# Patient Record
Sex: Female | Born: 1960 | Race: White | Hispanic: No | Marital: Married
Health system: Southern US, Community
[De-identification: ages and names within clinical notes are randomized; demographics above are authoritative.]

---
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
## 1999-06-03 ENCOUNTER — Other Ambulatory Visit: Admission: RE | Admit: 1999-06-03 | Discharge: 1999-06-03 | Payer: Self-pay | Admitting: Obstetrics and Gynecology

## 2000-09-16 ENCOUNTER — Other Ambulatory Visit: Admission: RE | Admit: 2000-09-16 | Discharge: 2000-09-16 | Payer: Self-pay | Admitting: Obstetrics and Gynecology

## 2001-09-20 ENCOUNTER — Other Ambulatory Visit: Admission: RE | Admit: 2001-09-20 | Discharge: 2001-09-20 | Payer: Self-pay | Admitting: Obstetrics and Gynecology

## 2002-09-20 ENCOUNTER — Other Ambulatory Visit: Admission: RE | Admit: 2002-09-20 | Discharge: 2002-09-20 | Payer: Self-pay | Admitting: Obstetrics and Gynecology

## 2003-10-01 ENCOUNTER — Other Ambulatory Visit: Admission: RE | Admit: 2003-10-01 | Discharge: 2003-10-01 | Payer: Self-pay | Admitting: Obstetrics and Gynecology

## 2004-12-27 HISTORY — PX: BREAST BIOPSY: SHX20

## 2005-02-11 ENCOUNTER — Other Ambulatory Visit: Admission: RE | Admit: 2005-02-11 | Discharge: 2005-02-11 | Payer: Self-pay | Admitting: Obstetrics and Gynecology

## 2005-03-08 ENCOUNTER — Encounter: Admission: RE | Admit: 2005-03-08 | Discharge: 2005-03-08 | Payer: Self-pay | Admitting: Obstetrics and Gynecology

## 2005-03-08 ENCOUNTER — Encounter (INDEPENDENT_AMBULATORY_CARE_PROVIDER_SITE_OTHER): Payer: Self-pay | Admitting: Specialist

## 2005-06-02 ENCOUNTER — Encounter: Admission: RE | Admit: 2005-06-02 | Discharge: 2005-06-02 | Payer: Self-pay | Admitting: Obstetrics and Gynecology

## 2005-12-29 ENCOUNTER — Encounter: Admission: RE | Admit: 2005-12-29 | Discharge: 2005-12-29 | Payer: Self-pay | Admitting: Obstetrics and Gynecology

## 2006-01-07 ENCOUNTER — Encounter: Admission: RE | Admit: 2006-01-07 | Discharge: 2006-01-07 | Payer: Self-pay | Admitting: Obstetrics and Gynecology

## 2006-02-15 ENCOUNTER — Other Ambulatory Visit: Admission: RE | Admit: 2006-02-15 | Discharge: 2006-02-15 | Payer: Self-pay | Admitting: Obstetrics and Gynecology

## 2006-03-16 IMAGING — US UNKNOWN US STUDY
1 series · 4 of 4 positions shown · non-contrast
Comparison: none

DIGITAL LIMITED RIGHT DIAGNOSTIC MAMMOGRAM AND RIGHT BREAST ULTRASOUND:
CLINICAL DATA: Questioned palpable finding right breast 9 o'clock location.

[Series 1: unknown us study · 4 of 4 slices shown]
[im 1/4]
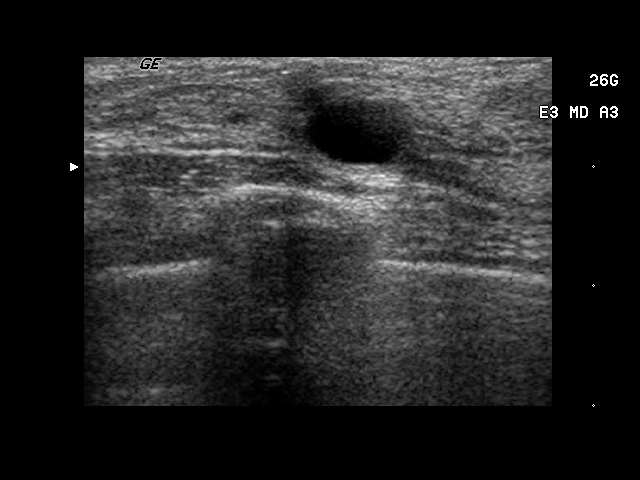
[im 2/4]
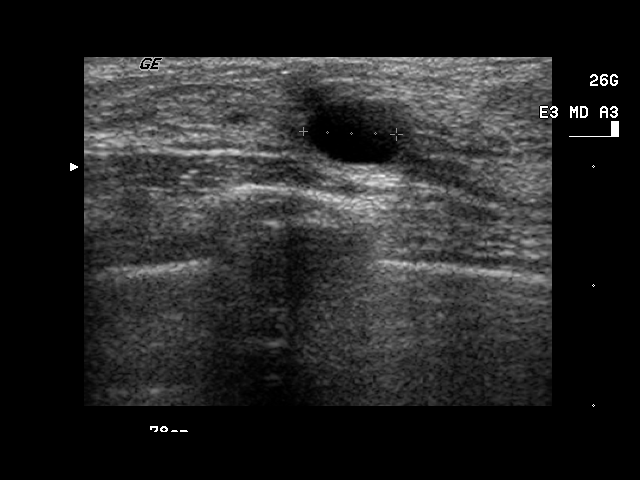
[im 3/4]
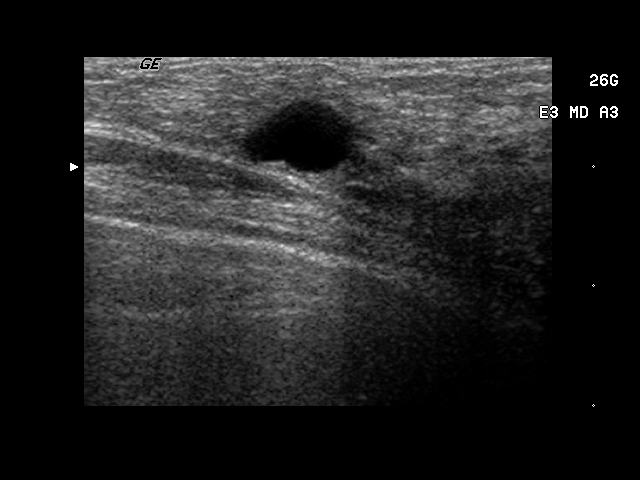
[im 4/4]
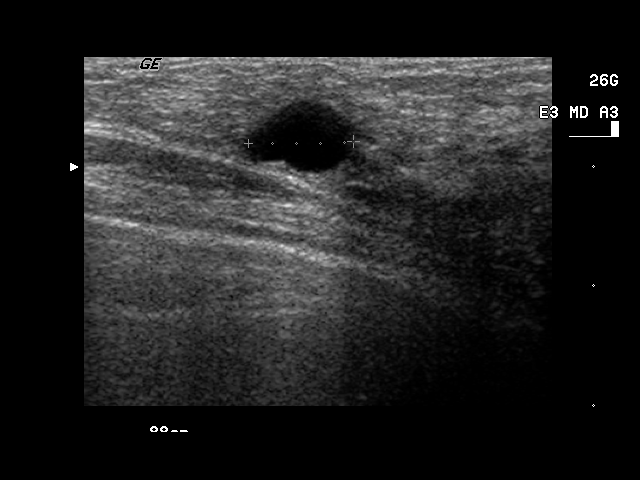

[4 of 4 positions shown; findings below may reference images not displayed]

Comparison with mammograms from [facility] [REDACTED] dated 12-29-05.

Spot compression mammography demonstrates extremely dense breast parenchyma but no definite focal 
mass.

Ultrasound of the right breast 9 o'clock location demonstrates a 9 mm simple-appearing cyst.  This 
corresponds to the questioned palpable finding.
IMPRESSION: No mammographic or sonographic evidence for malignancy in the right breast;  a simple-appearing 
cyst is seen which corresponds to the questioned palpable finding. The patient will be due for 
annual screening mammography in one year.  Findings and recommendations discussed with the patient 
and provided in written form at the time of the study.

ASSESSMENT: Benign - BI-RADS 2

Screening mammogram of both breasts in 1 year.

## 2007-01-05 ENCOUNTER — Encounter: Admission: RE | Admit: 2007-01-05 | Discharge: 2007-01-05 | Payer: Self-pay | Admitting: Obstetrics and Gynecology

## 2008-01-09 ENCOUNTER — Encounter: Admission: RE | Admit: 2008-01-09 | Discharge: 2008-01-09 | Payer: Self-pay | Admitting: Obstetrics and Gynecology

## 2009-01-14 ENCOUNTER — Encounter: Admission: RE | Admit: 2009-01-14 | Discharge: 2009-01-14 | Payer: Self-pay | Admitting: Obstetrics and Gynecology

## 2010-01-16 ENCOUNTER — Encounter: Admission: RE | Admit: 2010-01-16 | Discharge: 2010-01-16 | Payer: Self-pay | Admitting: Obstetrics and Gynecology

## 2010-06-23 ENCOUNTER — Encounter: Admission: RE | Admit: 2010-06-23 | Discharge: 2010-06-23 | Payer: Self-pay | Admitting: Obstetrics and Gynecology

## 2011-01-18 ENCOUNTER — Encounter
Admission: RE | Admit: 2011-01-18 | Discharge: 2011-01-18 | Payer: Self-pay | Source: Home / Self Care | Attending: Obstetrics and Gynecology | Admitting: Obstetrics and Gynecology

## 2011-12-17 ENCOUNTER — Other Ambulatory Visit: Payer: Self-pay | Admitting: Obstetrics and Gynecology

## 2011-12-17 DIAGNOSIS — Z1231 Encounter for screening mammogram for malignant neoplasm of breast: Secondary | ICD-10-CM

## 2012-01-20 ENCOUNTER — Ambulatory Visit
Admission: RE | Admit: 2012-01-20 | Discharge: 2012-01-20 | Disposition: A | Discharge status: Home / Self Care | Payer: BC Managed Care – PPO | Source: Ambulatory Visit | Attending: Obstetrics and Gynecology | Admitting: Obstetrics and Gynecology

## 2012-01-20 DIAGNOSIS — Z1231 Encounter for screening mammogram for malignant neoplasm of breast: Secondary | ICD-10-CM

## 2013-02-23 ENCOUNTER — Other Ambulatory Visit: Payer: Self-pay

## 2013-02-23 DIAGNOSIS — Z1231 Encounter for screening mammogram for malignant neoplasm of breast: Secondary | ICD-10-CM

## 2013-02-26 ENCOUNTER — Ambulatory Visit: Payer: BC Managed Care – PPO

## 2013-03-28 ENCOUNTER — Ambulatory Visit
Admission: RE | Admit: 2013-03-28 | Discharge: 2013-03-28 | Disposition: A | Discharge status: Home / Self Care | Payer: BC Managed Care – PPO | Source: Ambulatory Visit

## 2013-03-28 DIAGNOSIS — Z1231 Encounter for screening mammogram for malignant neoplasm of breast: Secondary | ICD-10-CM

## 2014-04-12 ENCOUNTER — Other Ambulatory Visit: Payer: Self-pay

## 2014-04-12 DIAGNOSIS — Z1231 Encounter for screening mammogram for malignant neoplasm of breast: Secondary | ICD-10-CM

## 2014-04-19 ENCOUNTER — Ambulatory Visit
Admission: RE | Admit: 2014-04-19 | Discharge: 2014-04-19 | Disposition: A | Discharge status: Home / Self Care | Payer: BC Managed Care – PPO | Source: Ambulatory Visit

## 2014-04-19 ENCOUNTER — Encounter (INDEPENDENT_AMBULATORY_CARE_PROVIDER_SITE_OTHER): Payer: Self-pay

## 2014-04-19 DIAGNOSIS — Z1231 Encounter for screening mammogram for malignant neoplasm of breast: Secondary | ICD-10-CM

## 2014-04-23 ENCOUNTER — Other Ambulatory Visit: Payer: Self-pay | Admitting: Obstetrics and Gynecology

## 2014-04-23 DIAGNOSIS — N63 Unspecified lump in unspecified breast: Secondary | ICD-10-CM

## 2014-04-30 ENCOUNTER — Other Ambulatory Visit: Payer: Self-pay | Admitting: Obstetrics and Gynecology

## 2014-04-30 DIAGNOSIS — N644 Mastodynia: Secondary | ICD-10-CM

## 2014-04-30 DIAGNOSIS — N63 Unspecified lump in unspecified breast: Secondary | ICD-10-CM

## 2014-05-08 ENCOUNTER — Other Ambulatory Visit: Payer: BC Managed Care – PPO

## 2014-05-10 ENCOUNTER — Ambulatory Visit
Admission: RE | Admit: 2014-05-10 | Discharge: 2014-05-10 | Disposition: A | Discharge status: Home / Self Care | Payer: BC Managed Care – PPO | Source: Ambulatory Visit | Attending: Obstetrics and Gynecology | Admitting: Obstetrics and Gynecology

## 2014-05-10 DIAGNOSIS — N644 Mastodynia: Secondary | ICD-10-CM

## 2014-05-10 DIAGNOSIS — N63 Unspecified lump in unspecified breast: Secondary | ICD-10-CM

## 2015-04-29 ENCOUNTER — Other Ambulatory Visit: Payer: Self-pay

## 2015-04-29 DIAGNOSIS — Z1231 Encounter for screening mammogram for malignant neoplasm of breast: Secondary | ICD-10-CM

## 2015-05-14 ENCOUNTER — Ambulatory Visit
Admission: RE | Admit: 2015-05-14 | Discharge: 2015-05-14 | Disposition: A | Discharge status: Home / Self Care | Payer: BC Managed Care – PPO | Source: Ambulatory Visit

## 2015-05-14 DIAGNOSIS — Z1231 Encounter for screening mammogram for malignant neoplasm of breast: Secondary | ICD-10-CM

## 2015-05-15 ENCOUNTER — Other Ambulatory Visit: Payer: Self-pay | Admitting: Obstetrics and Gynecology

## 2015-05-15 DIAGNOSIS — R928 Other abnormal and inconclusive findings on diagnostic imaging of breast: Secondary | ICD-10-CM

## 2015-05-22 ENCOUNTER — Ambulatory Visit
Admission: RE | Admit: 2015-05-22 | Discharge: 2015-05-22 | Disposition: A | Discharge status: Home / Self Care | Payer: BC Managed Care – PPO | Source: Ambulatory Visit | Attending: Obstetrics and Gynecology | Admitting: Obstetrics and Gynecology

## 2015-05-22 DIAGNOSIS — R928 Other abnormal and inconclusive findings on diagnostic imaging of breast: Secondary | ICD-10-CM

## 2015-07-24 ENCOUNTER — Other Ambulatory Visit: Payer: Self-pay | Admitting: Obstetrics and Gynecology

## 2015-07-28 LAB — CYTOLOGY - PAP

## 2016-04-19 ENCOUNTER — Other Ambulatory Visit: Payer: Self-pay

## 2016-04-19 DIAGNOSIS — Z1231 Encounter for screening mammogram for malignant neoplasm of breast: Secondary | ICD-10-CM

## 2016-05-19 ENCOUNTER — Ambulatory Visit
Admission: RE | Admit: 2016-05-19 | Discharge: 2016-05-19 | Disposition: A | Discharge status: Home / Self Care | Payer: BC Managed Care – PPO | Source: Ambulatory Visit

## 2016-05-19 DIAGNOSIS — Z1231 Encounter for screening mammogram for malignant neoplasm of breast: Secondary | ICD-10-CM

## 2017-04-11 ENCOUNTER — Other Ambulatory Visit: Payer: Self-pay | Admitting: Obstetrics and Gynecology

## 2017-04-11 DIAGNOSIS — Z1231 Encounter for screening mammogram for malignant neoplasm of breast: Secondary | ICD-10-CM

## 2017-05-24 ENCOUNTER — Ambulatory Visit
Admission: RE | Admit: 2017-05-24 | Discharge: 2017-05-24 | Disposition: A | Discharge status: Home / Self Care | Payer: BC Managed Care – PPO | Source: Ambulatory Visit | Attending: Obstetrics and Gynecology | Admitting: Obstetrics and Gynecology

## 2017-05-24 DIAGNOSIS — Z1231 Encounter for screening mammogram for malignant neoplasm of breast: Secondary | ICD-10-CM

## 2018-04-14 ENCOUNTER — Other Ambulatory Visit: Payer: Self-pay | Admitting: Obstetrics and Gynecology

## 2018-04-14 DIAGNOSIS — Z1231 Encounter for screening mammogram for malignant neoplasm of breast: Secondary | ICD-10-CM

## 2018-06-05 ENCOUNTER — Ambulatory Visit
Admission: RE | Admit: 2018-06-05 | Discharge: 2018-06-05 | Disposition: A | Discharge status: Home / Self Care | Payer: BC Managed Care – PPO | Source: Ambulatory Visit | Attending: Obstetrics and Gynecology | Admitting: Obstetrics and Gynecology

## 2018-06-05 DIAGNOSIS — Z1231 Encounter for screening mammogram for malignant neoplasm of breast: Secondary | ICD-10-CM

## 2019-05-15 ENCOUNTER — Other Ambulatory Visit: Payer: Self-pay | Admitting: Obstetrics and Gynecology

## 2019-05-15 DIAGNOSIS — Z1231 Encounter for screening mammogram for malignant neoplasm of breast: Secondary | ICD-10-CM

## 2019-07-03 ENCOUNTER — Other Ambulatory Visit: Payer: Self-pay

## 2019-07-03 ENCOUNTER — Ambulatory Visit
Admission: RE | Admit: 2019-07-03 | Discharge: 2019-07-03 | Disposition: A | Discharge status: Home / Self Care | Payer: BC Managed Care – PPO | Source: Ambulatory Visit | Attending: Obstetrics and Gynecology | Admitting: Obstetrics and Gynecology

## 2019-07-03 DIAGNOSIS — Z1231 Encounter for screening mammogram for malignant neoplasm of breast: Secondary | ICD-10-CM

## 2020-06-03 ENCOUNTER — Other Ambulatory Visit: Payer: Self-pay | Admitting: Obstetrics and Gynecology

## 2020-06-03 DIAGNOSIS — Z1231 Encounter for screening mammogram for malignant neoplasm of breast: Secondary | ICD-10-CM

## 2020-07-08 ENCOUNTER — Ambulatory Visit
Admission: RE | Admit: 2020-07-08 | Discharge: 2020-07-08 | Disposition: A | Discharge status: Home / Self Care | Payer: BC Managed Care – PPO | Source: Ambulatory Visit | Attending: Obstetrics and Gynecology | Admitting: Obstetrics and Gynecology

## 2020-07-08 ENCOUNTER — Other Ambulatory Visit: Payer: Self-pay

## 2020-07-08 DIAGNOSIS — Z1231 Encounter for screening mammogram for malignant neoplasm of breast: Secondary | ICD-10-CM

## 2021-03-12 ENCOUNTER — Other Ambulatory Visit: Payer: Self-pay | Admitting: Obstetrics and Gynecology

## 2021-03-12 DIAGNOSIS — N644 Mastodynia: Secondary | ICD-10-CM

## 2021-04-24 ENCOUNTER — Ambulatory Visit
Admission: RE | Admit: 2021-04-24 | Discharge: 2021-04-24 | Disposition: A | Discharge status: Home / Self Care | Payer: BC Managed Care – PPO | Source: Ambulatory Visit | Attending: Obstetrics and Gynecology | Admitting: Obstetrics and Gynecology

## 2021-04-24 ENCOUNTER — Other Ambulatory Visit: Payer: Self-pay

## 2021-04-24 DIAGNOSIS — N644 Mastodynia: Secondary | ICD-10-CM

## 2021-06-26 ENCOUNTER — Other Ambulatory Visit: Payer: Self-pay | Admitting: Obstetrics and Gynecology

## 2021-06-26 DIAGNOSIS — Z1231 Encounter for screening mammogram for malignant neoplasm of breast: Secondary | ICD-10-CM

## 2021-07-01 IMAGING — US US BREAST*R* LIMITED INC AXILLA
1 series · 9 of 9 positions shown · non-contrast
Comparison: Previous exam(s).

CLINICAL DATA: 60-year-old female presenting with a possible lump
and pain throughout the outer right breast for approximately 5
weeks. She is no longer able to feel the lump. Patient has a history
of remote right excisional biopsy.

EXAM:
DIGITAL DIAGNOSTIC UNILATERAL RIGHT MAMMOGRAM WITH TOMOSYNTHESIS AND
CAD; ULTRASOUND RIGHT BREAST LIMITED
TECHNIQUE: Right digital diagnostic mammography and breast tomosynthesis was
performed. The images were evaluated with computer-aided detection.;
Targeted ultrasound examination of the right breast was performed

[Series 1: us breast*right* limited inc axilla · 0.05mm/px · 9 of 9 slices shown]
[im 1/9]
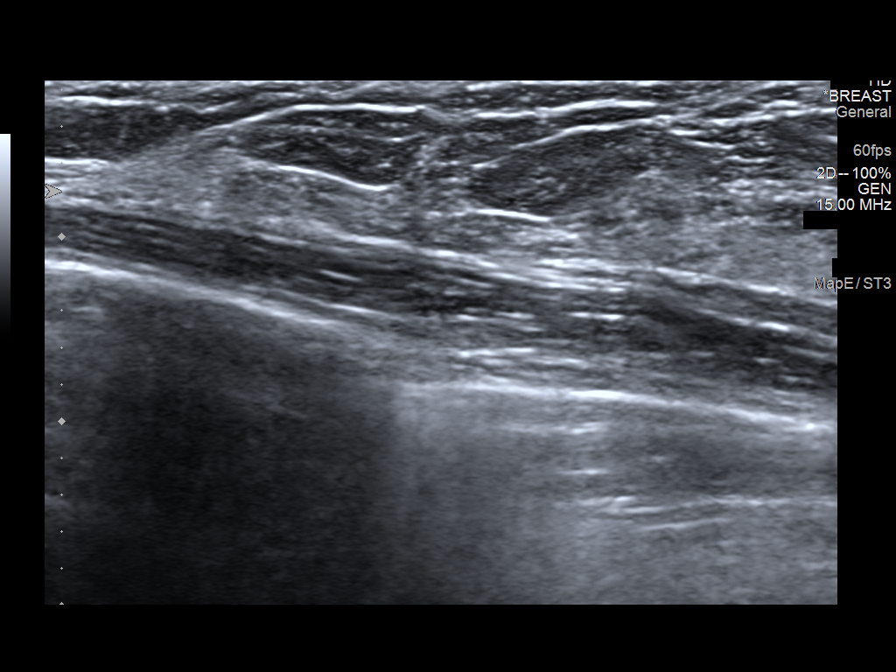
[im 2/9]
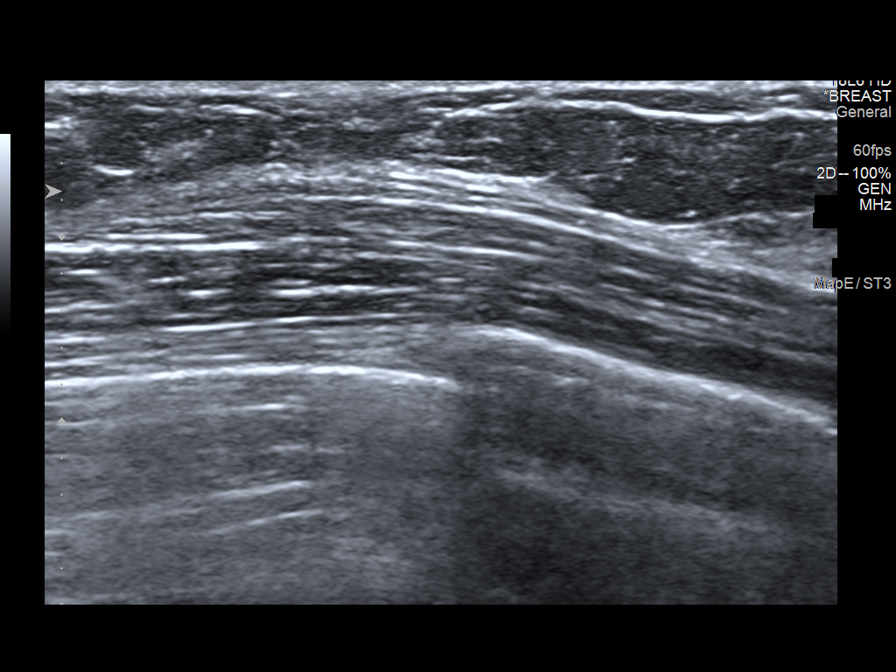
[im 3/9]
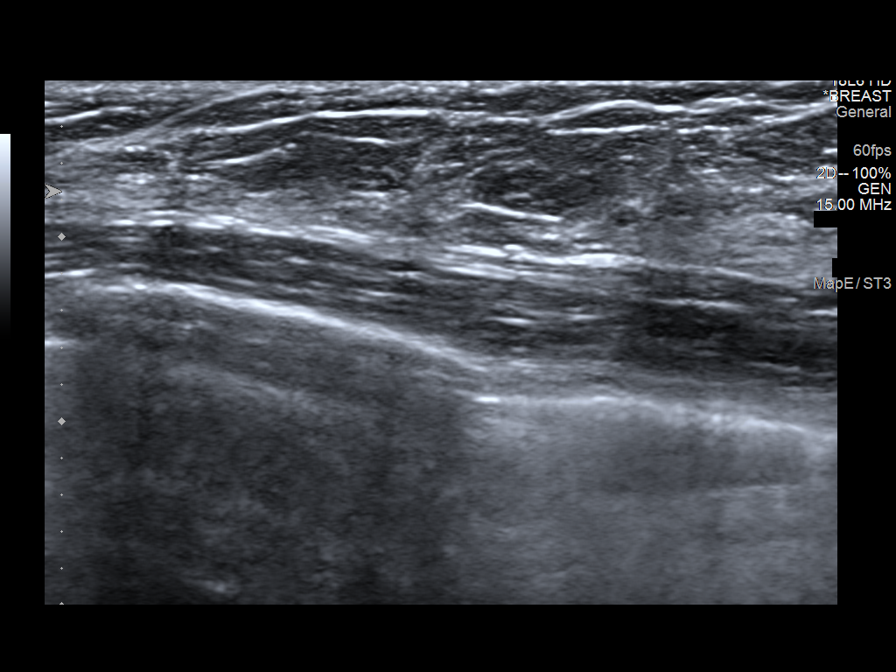
[im 4/9]
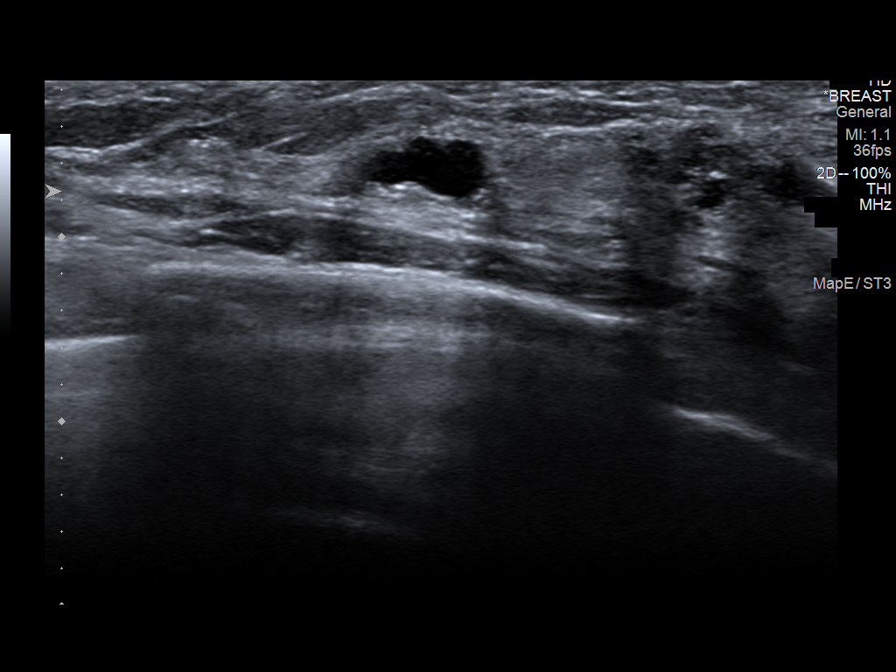
[im 5/9]
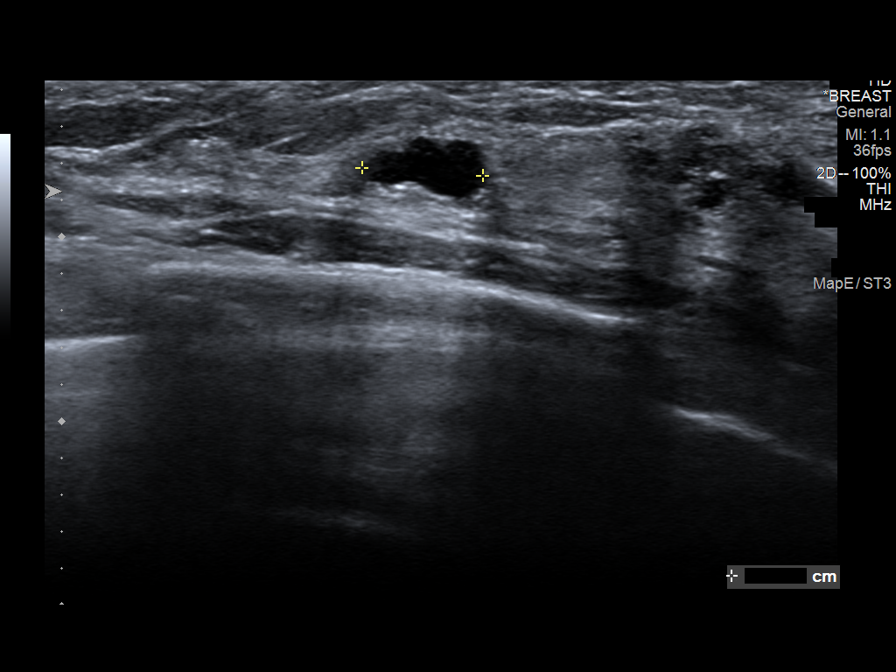
[im 6/9]
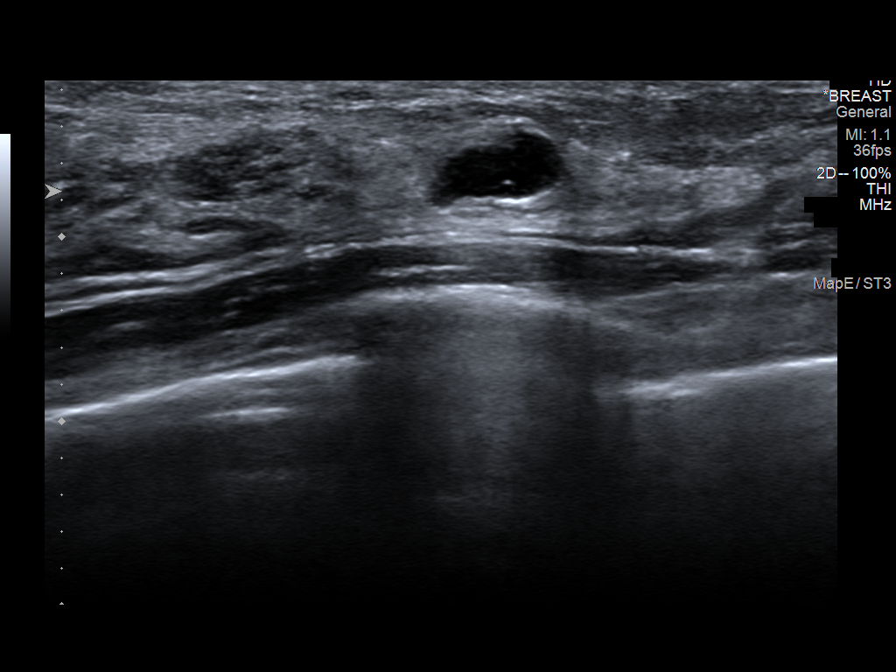
[im 7/9]
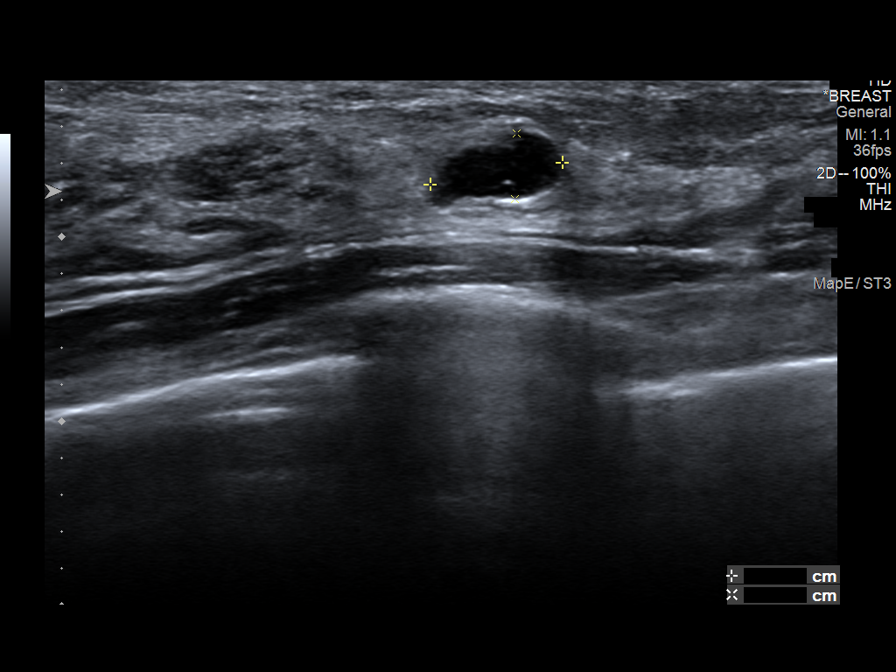
[im 8/9]
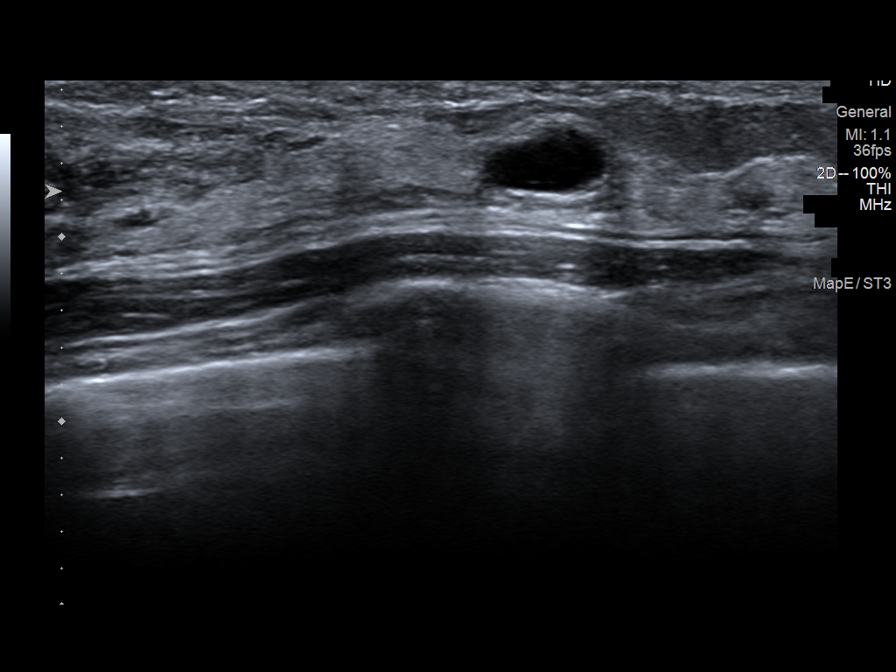
[im 9/9]
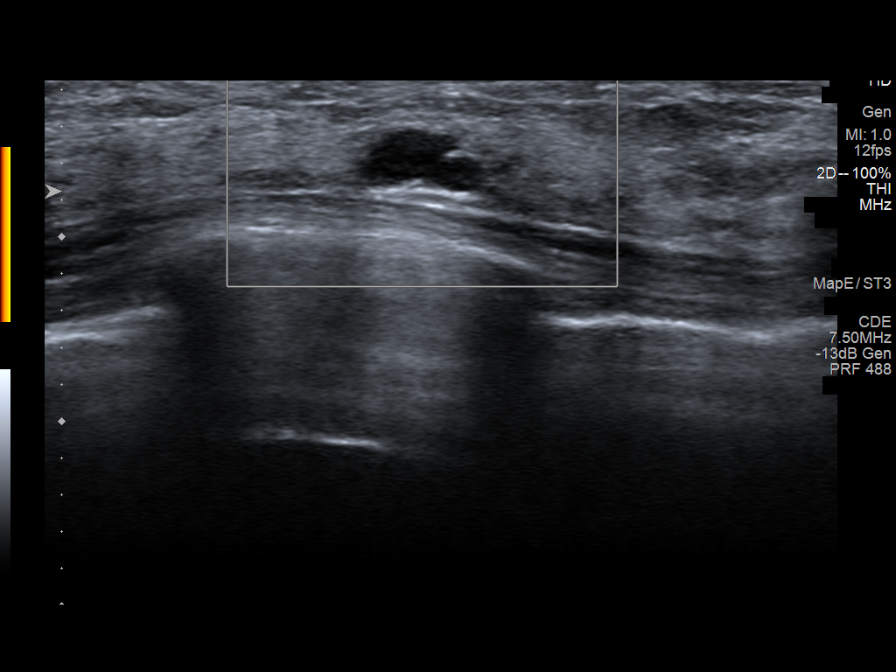

[9 of 9 positions shown; findings below may reference images not displayed]

ACR Breast Density Category d: The breast tissue is extremely dense,
which lowers the sensitivity of mammography.
FINDINGS: Mammogram:

Full field tomosynthesis views of the right breast were performed.
There is no new abnormality in the outer right breast to explain the
patient's pain. No suspicious mass, distortion, or
microcalcifications are identified to suggest presence of
malignancy.

On physical exam at the site of concern reported by the patient in
the outer right breast I do not feel a fixed discrete mass.

Ultrasound:

Targeted ultrasound performed throughout the outer aspect of the
right breast at the site of concern reported by the patient
demonstrating a possibly incidental oval circumscribed anechoic mass
at 9:30 o'clock 5 cm from the nipple, consistent with a benign
simple cyst. This measures 0.7 x 0.4 x 0.7 cm. No internal
vascularity.
IMPRESSION: At the palpable site of concern in the outer right breast there is a
possibly incidental subcentimeter benign simple cyst. No
mammographic or sonographic evidence of malignancy.

RECOMMENDATION:
Return to routine screening which will be due in June 2021.

I have discussed the findings and recommendations with the patient.
If applicable, a reminder letter will be sent to the patient
regarding the next appointment.

BI-RADS CATEGORY  2: Benign.

## 2021-08-24 ENCOUNTER — Other Ambulatory Visit: Payer: Self-pay

## 2021-08-24 ENCOUNTER — Ambulatory Visit
Admission: RE | Admit: 2021-08-24 | Discharge: 2021-08-24 | Disposition: A | Discharge status: Home / Self Care | Payer: BC Managed Care – PPO | Source: Ambulatory Visit | Attending: Obstetrics and Gynecology | Admitting: Obstetrics and Gynecology

## 2021-08-24 DIAGNOSIS — Z1231 Encounter for screening mammogram for malignant neoplasm of breast: Secondary | ICD-10-CM

## 2021-10-31 IMAGING — MG MM DIGITAL SCREENING BILAT W/ TOMO AND CAD
8 series · 9 of 24 positions shown · non-contrast
Comparison: Previous exam(s).

CLINICAL DATA: Screening.

EXAM:
DIGITAL SCREENING BILATERAL MAMMOGRAM WITH TOMOSYNTHESIS AND CAD
TECHNIQUE: Bilateral screening digital craniocaudal and mediolateral oblique
mammograms were obtained. Bilateral screening digital breast
tomosynthesis was performed. The images were evaluated with
computer-aided detection.

[L MLO synth-2D]
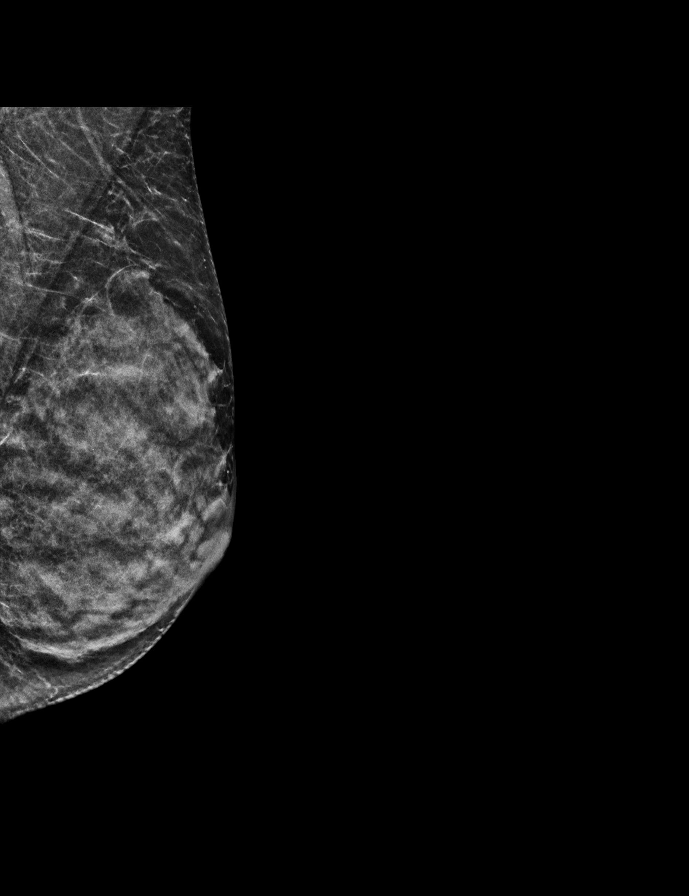

[R CC synth-2D]
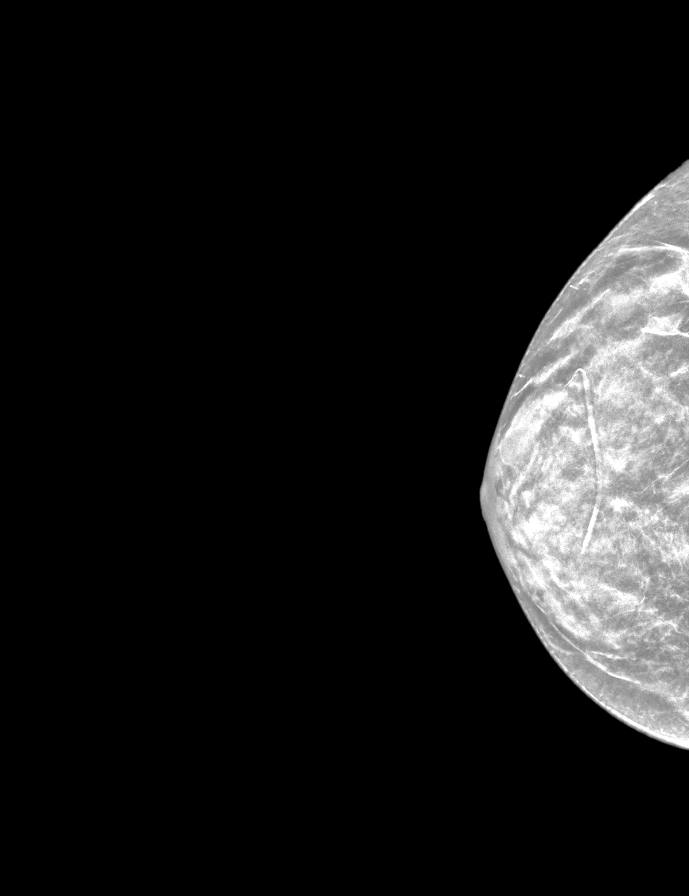

[L CC synth-2D]
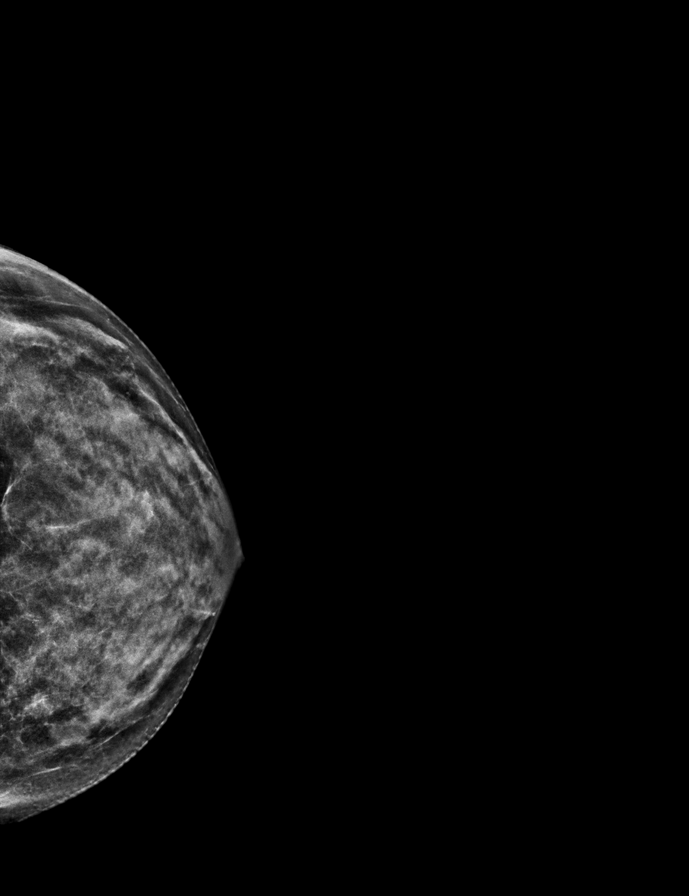

[R MLO synth-2D]
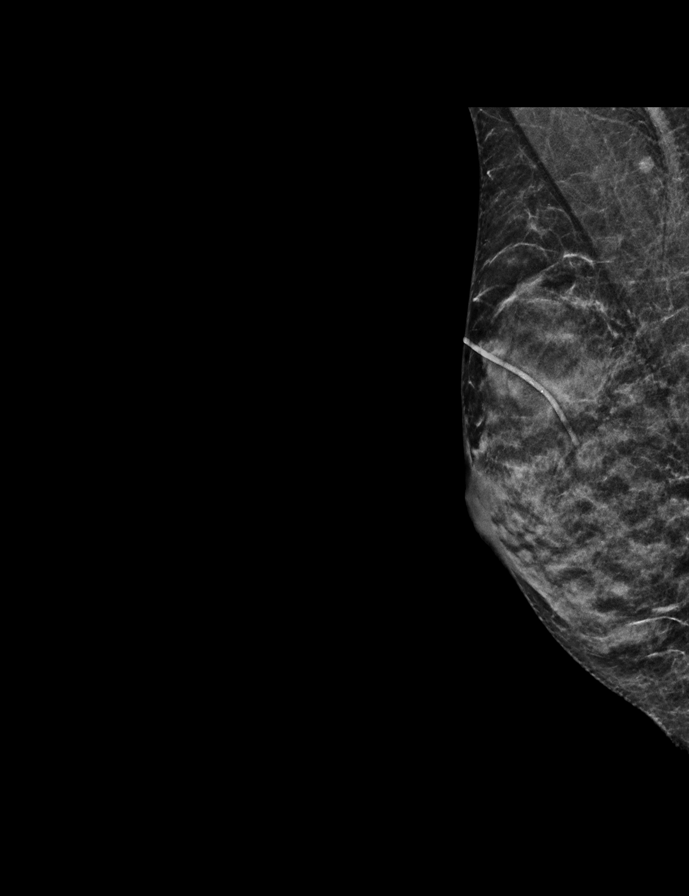

[R CC tomo · 2 of 42 frames shown]
[frame 14/42]
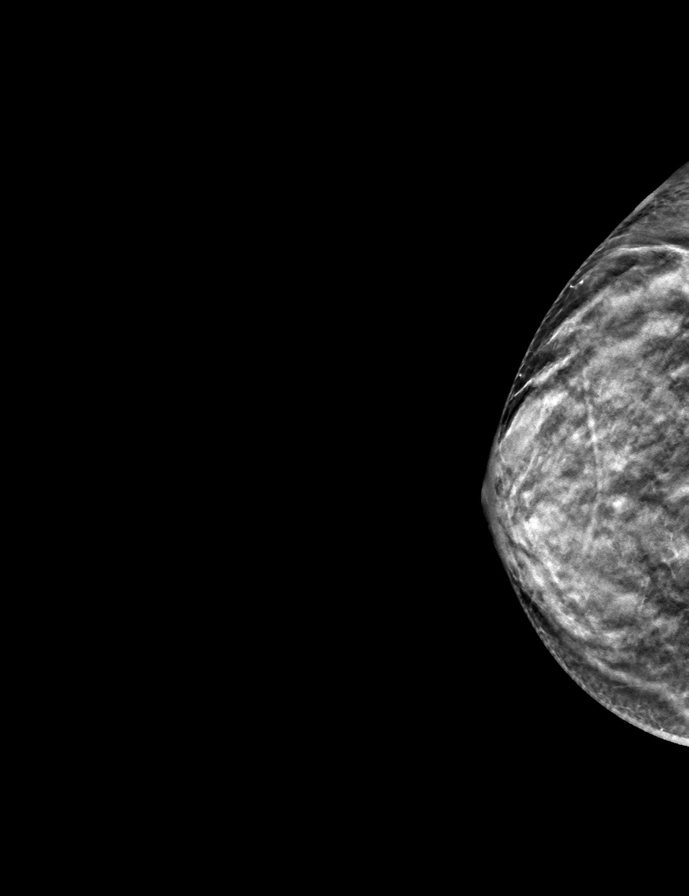
[frame 21/42]
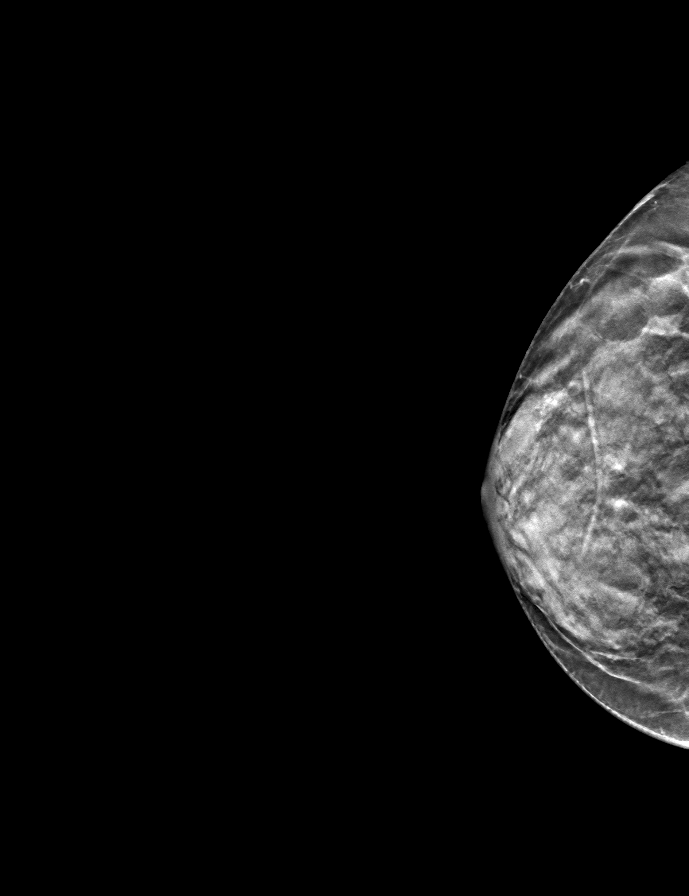

[L CC tomo · tomo slice 25/49.0]
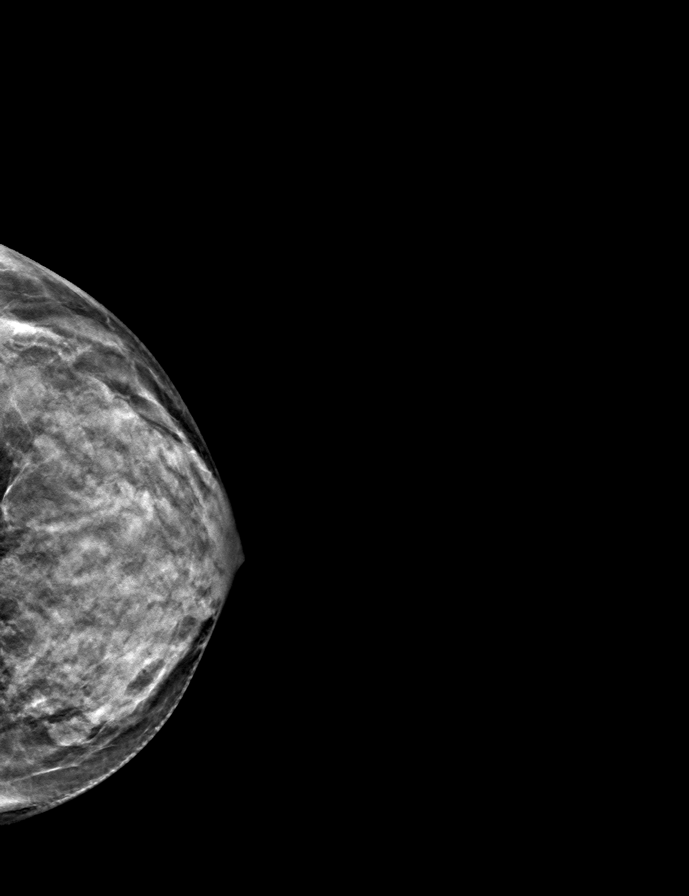

[R MLO tomo · tomo slice 21/41.0]
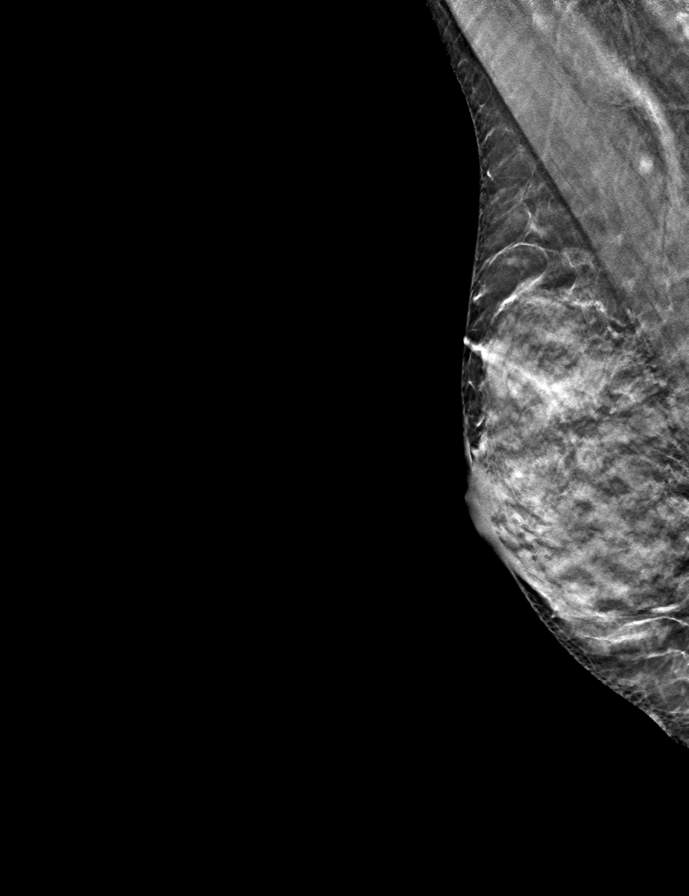

[L MLO tomo · tomo slice 23/46.0]
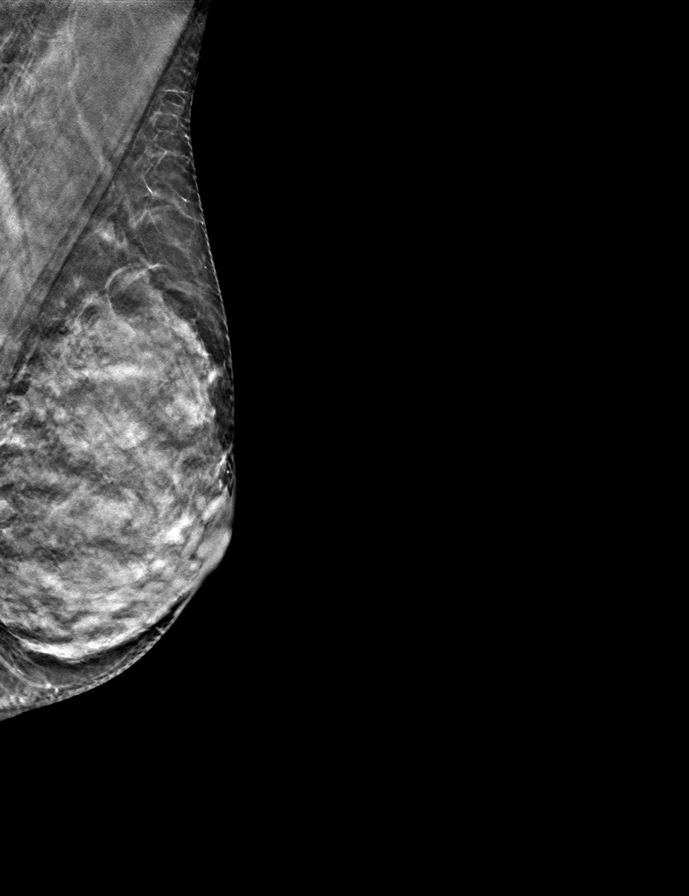

[9 of 24 positions shown; findings below may reference images not displayed]

ACR Breast Density Category d: The breast tissue is extremely dense,
which lowers the sensitivity of mammography
FINDINGS: There are no findings suspicious for malignancy.
IMPRESSION: No mammographic evidence of malignancy. A result letter of this
screening mammogram will be mailed directly to the patient.

RECOMMENDATION:
Screening mammogram in one year. (Code:[id number])

BI-RADS CATEGORY  1: Negative.

## 2022-07-22 ENCOUNTER — Other Ambulatory Visit: Payer: Self-pay | Admitting: Obstetrics and Gynecology

## 2022-07-22 DIAGNOSIS — Z1231 Encounter for screening mammogram for malignant neoplasm of breast: Secondary | ICD-10-CM

## 2022-08-25 ENCOUNTER — Ambulatory Visit: Payer: BC Managed Care – PPO

## 2022-08-25 ENCOUNTER — Ambulatory Visit
Admission: RE | Admit: 2022-08-25 | Discharge: 2022-08-25 | Disposition: A | Discharge status: Home / Self Care | Payer: BC Managed Care – PPO | Source: Ambulatory Visit | Attending: Obstetrics and Gynecology | Admitting: Obstetrics and Gynecology

## 2022-08-25 DIAGNOSIS — Z1231 Encounter for screening mammogram for malignant neoplasm of breast: Secondary | ICD-10-CM

## 2023-04-11 ENCOUNTER — Other Ambulatory Visit: Payer: Self-pay | Admitting: Internal Medicine

## 2023-04-11 DIAGNOSIS — Z1231 Encounter for screening mammogram for malignant neoplasm of breast: Secondary | ICD-10-CM

## 2023-08-08 ENCOUNTER — Other Ambulatory Visit: Payer: Self-pay

## 2023-08-08 DIAGNOSIS — N6310 Unspecified lump in the right breast, unspecified quadrant: Secondary | ICD-10-CM

## 2023-08-30 ENCOUNTER — Ambulatory Visit
Admission: RE | Admit: 2023-08-30 | Discharge: 2023-08-30 | Disposition: A | Discharge status: Home / Self Care | Payer: BC Managed Care – PPO | Source: Ambulatory Visit

## 2023-08-30 ENCOUNTER — Other Ambulatory Visit: Payer: Self-pay | Admitting: Obstetrics and Gynecology

## 2023-08-30 DIAGNOSIS — N6310 Unspecified lump in the right breast, unspecified quadrant: Secondary | ICD-10-CM

## 2023-09-05 ENCOUNTER — Ambulatory Visit: Payer: BC Managed Care – PPO

## 2024-08-03 ENCOUNTER — Other Ambulatory Visit: Payer: Self-pay | Admitting: Obstetrics and Gynecology

## 2024-08-03 DIAGNOSIS — Z1231 Encounter for screening mammogram for malignant neoplasm of breast: Secondary | ICD-10-CM

## 2024-09-17 ENCOUNTER — Ambulatory Visit
Admission: RE | Admit: 2024-09-17 | Discharge: 2024-09-17 | Disposition: A | Discharge status: Home / Self Care | Payer: Self-pay | Source: Ambulatory Visit | Attending: Obstetrics and Gynecology | Admitting: Obstetrics and Gynecology

## 2024-09-17 DIAGNOSIS — Z1231 Encounter for screening mammogram for malignant neoplasm of breast: Secondary | ICD-10-CM
